# Patient Record
Sex: Female | Born: 1970 | Race: Black or African American | Hispanic: No | Marital: Married
Health system: Southern US, Community
[De-identification: ages and names within clinical notes are randomized; demographics above are authoritative.]

---

## 2008-10-30 ENCOUNTER — Ambulatory Visit: Payer: Self-pay

## 2009-11-19 ENCOUNTER — Ambulatory Visit: Payer: Self-pay

## 2010-11-25 ENCOUNTER — Ambulatory Visit: Payer: Self-pay

## 2011-12-11 ENCOUNTER — Emergency Department: Payer: Self-pay | Admitting: *Deleted

## 2011-12-11 LAB — URINALYSIS, COMPLETE
Blood: NEGATIVE
Glucose,UR: NEGATIVE mg/dL (ref 0–75)
Ketone: NEGATIVE
Leukocyte Esterase: NEGATIVE
Nitrite: NEGATIVE
Ph: 7 (ref 4.5–8.0)
Protein: 100
RBC,UR: <1 /HPF (ref 0–5)
Squamous Epithelial: 4
WBC UR: 1 /HPF (ref 0–5)

## 2011-12-11 LAB — LIPASE, BLOOD: Lipase: 79 U/L (ref 73–393)

## 2011-12-11 LAB — COMPREHENSIVE METABOLIC PANEL
Albumin: 4 g/dL (ref 3.4–5.0)
Alkaline Phosphatase: 101 U/L (ref 50–136)
BUN: 8 mg/dL (ref 7–18)
Bilirubin,Total: 0.3 mg/dL (ref 0.2–1.0)
Co2: 25 mmol/L (ref 21–32)
Creatinine: 0.81 mg/dL (ref 0.60–1.30)
EGFR (Non-African Amer.): >60
Osmolality: 279 (ref 275–301)
SGOT(AST): 20 U/L (ref 15–37)
SGPT (ALT): 13 U/L
Sodium: 140 mmol/L (ref 136–145)
Total Protein: 8.4 g/dL — ABNORMAL HIGH (ref 6.4–8.2)

## 2011-12-11 LAB — TROPONIN I: Troponin-I: <0.02 ng/mL

## 2011-12-11 LAB — PREGNANCY, URINE: Pregnancy Test, Urine: NEGATIVE m[IU]/mL

## 2011-12-11 LAB — CBC
HGB: 10.4 g/dL — ABNORMAL LOW (ref 12.0–16.0)
MCV: 78 fL — ABNORMAL LOW (ref 80–100)
Platelet: 403 10*3/uL (ref 150–440)
RDW: 16.5 % — ABNORMAL HIGH (ref 11.5–14.5)
WBC: 7.5 10*3/uL (ref 3.6–11.0)

## 2011-12-16 ENCOUNTER — Ambulatory Visit: Payer: Self-pay | Admitting: Internal Medicine

## 2011-12-16 ENCOUNTER — Inpatient Hospital Stay: Payer: Self-pay | Admitting: Surgery

## 2011-12-16 LAB — COMPREHENSIVE METABOLIC PANEL
Albumin: 3.2 g/dL — ABNORMAL LOW (ref 3.4–5.0)
BUN: 8 mg/dL (ref 7–18)
Bilirubin,Total: 0.4 mg/dL (ref 0.2–1.0)
Chloride: 105 mmol/L (ref 98–107)
Creatinine: 0.72 mg/dL (ref 0.60–1.30)
EGFR (African American): >60
EGFR (Non-African Amer.): >60
Osmolality: 279 (ref 275–301)
SGOT(AST): 14 U/L — ABNORMAL LOW (ref 15–37)
Sodium: 141 mmol/L (ref 136–145)

## 2011-12-16 LAB — CBC WITH DIFFERENTIAL/PLATELET
Basophil #: 0 10*3/uL (ref 0.0–0.1)
Basophil %: 0.1 %
Eosinophil %: 1.7 %
HCT: 29 % — ABNORMAL LOW (ref 35.0–47.0)
MCH: 24.8 pg — ABNORMAL LOW (ref 26.0–34.0)
MCHC: 31.5 g/dL — ABNORMAL LOW (ref 32.0–36.0)
MCV: 79 fL — ABNORMAL LOW (ref 80–100)
Monocyte #: 0.6 10*3/uL (ref 0.0–0.7)
Monocyte %: 8.9 %
Neutrophil %: 70.6 %
Platelet: 361 10*3/uL (ref 150–440)
RBC: 3.7 10*6/uL — ABNORMAL LOW (ref 3.80–5.20)
WBC: 7.2 10*3/uL (ref 3.6–11.0)

## 2011-12-17 LAB — BASIC METABOLIC PANEL
BUN: 5 mg/dL — ABNORMAL LOW (ref 7–18)
Calcium, Total: 8.3 mg/dL — ABNORMAL LOW (ref 8.5–10.1)
Chloride: 108 mmol/L — ABNORMAL HIGH (ref 98–107)
Co2: 26 mmol/L (ref 21–32)
Creatinine: 0.75 mg/dL (ref 0.60–1.30)
Glucose: 108 mg/dL — ABNORMAL HIGH (ref 65–99)
Osmolality: 281 (ref 275–301)
Sodium: 142 mmol/L (ref 136–145)

## 2011-12-21 LAB — PATHOLOGY REPORT

## 2012-02-14 ENCOUNTER — Ambulatory Visit: Payer: Self-pay

## 2012-05-09 ENCOUNTER — Ambulatory Visit: Payer: Self-pay | Admitting: Obstetrics & Gynecology

## 2012-05-09 LAB — CBC
HCT: 33.3 % — ABNORMAL LOW (ref 35.0–47.0)
MCH: 23 pg — ABNORMAL LOW (ref 26.0–34.0)
MCHC: 31 g/dL — ABNORMAL LOW (ref 32.0–36.0)
RDW: 18.1 % — ABNORMAL HIGH (ref 11.5–14.5)

## 2012-05-18 ENCOUNTER — Ambulatory Visit: Payer: Self-pay | Admitting: Obstetrics & Gynecology

## 2012-05-18 LAB — PREGNANCY, URINE: Pregnancy Test, Urine: NEGATIVE m[IU]/mL

## 2012-05-22 LAB — PATHOLOGY REPORT

## 2013-02-15 ENCOUNTER — Ambulatory Visit: Payer: Self-pay

## 2014-01-15 IMAGING — US ABDOMEN ULTRASOUND LIMITED
1 series · 17 of 25 positions shown · non-contrast
Comparison: none

REASON FOR EXAM: abdominal pain
COMMENTS:

PROCEDURE:     FAZLJI - FAZLJI ABDOMEN UPPER GENERAL  - December 16, 2011  [DATE]
RESULT:

[Series 1: abdomen ultrasound limited · 17 of 79 slices shown]
[im 1/79]
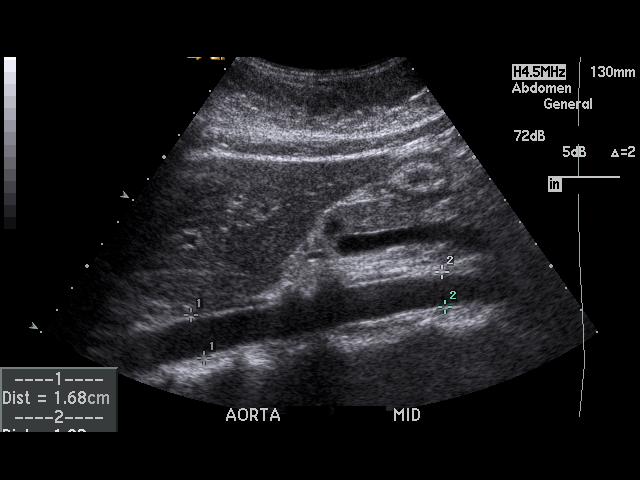
[im 7/79]
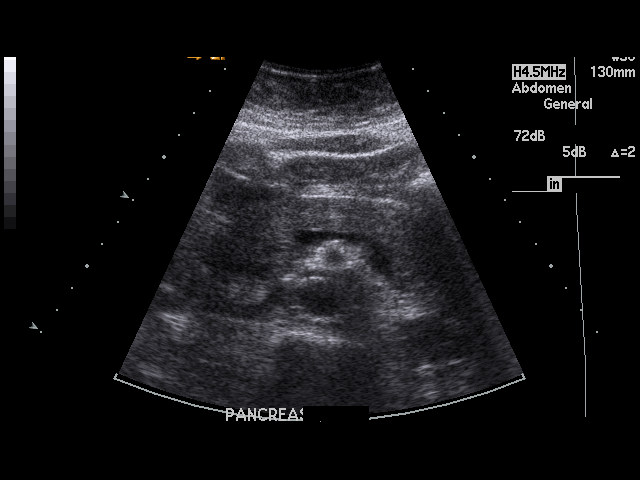
[im 10/79]
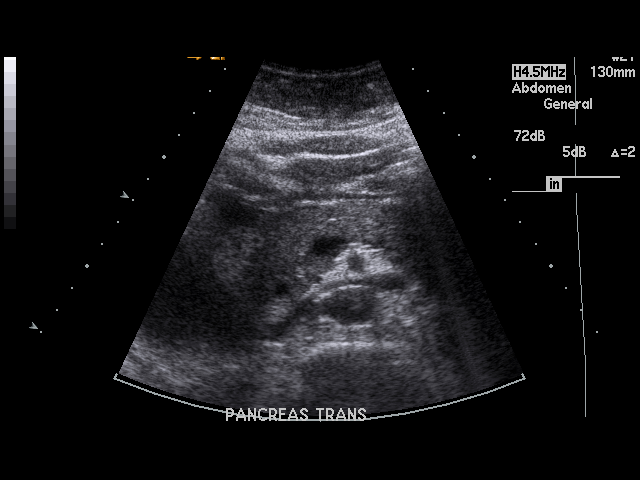
[im 17/79]
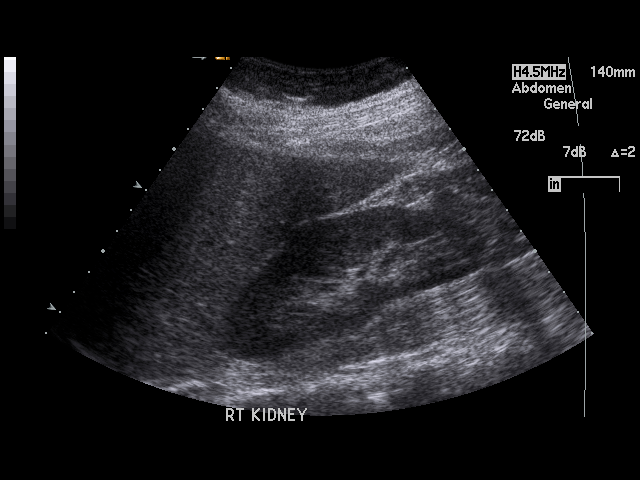
[im 20/79]
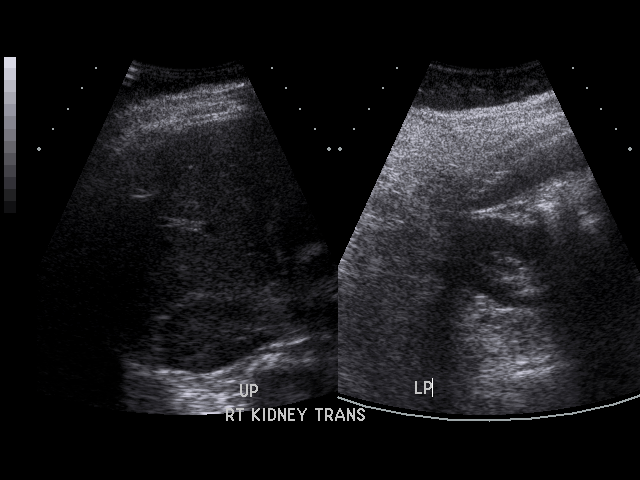
[im 27/79]
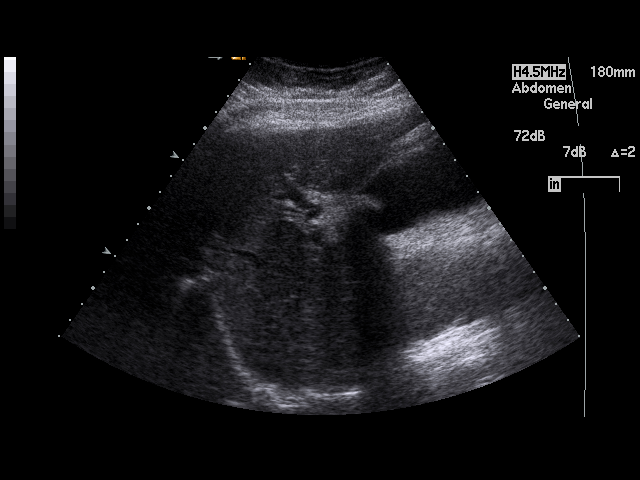
[im 30/79]
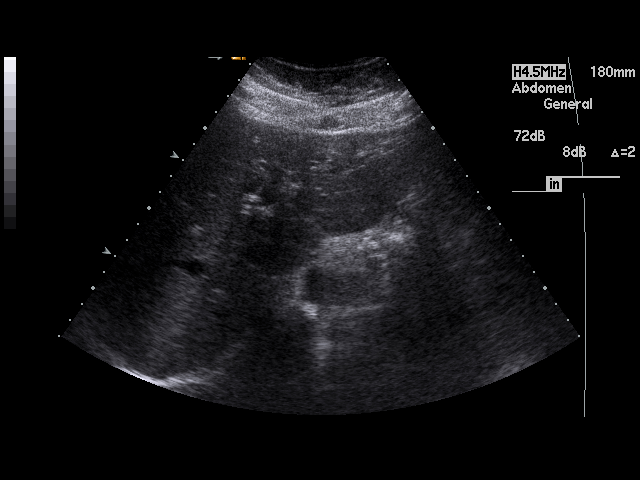
[im 36/79]
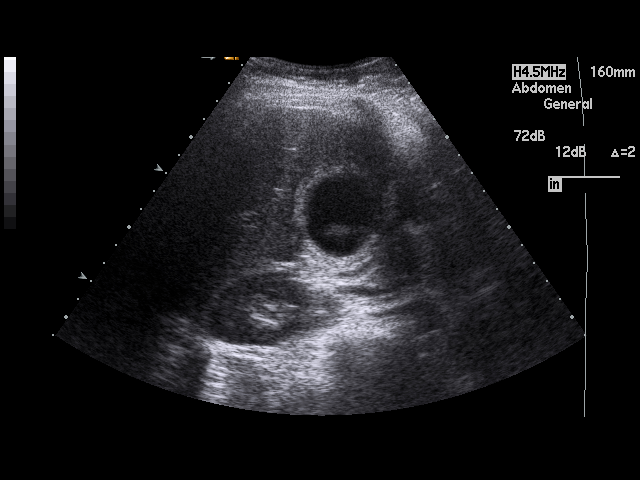
[im 40/79]
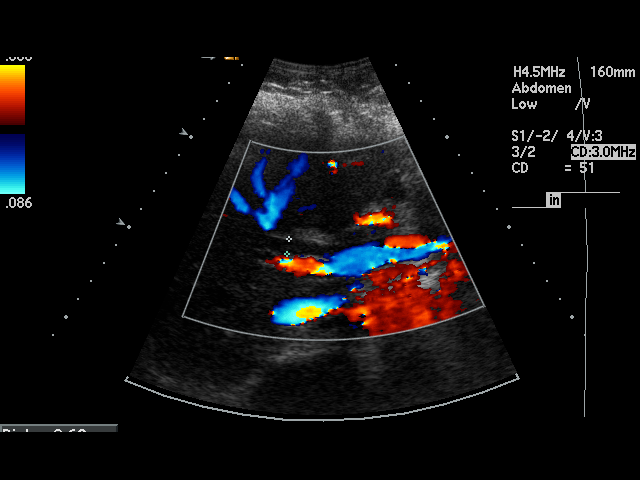
[im 43/79]
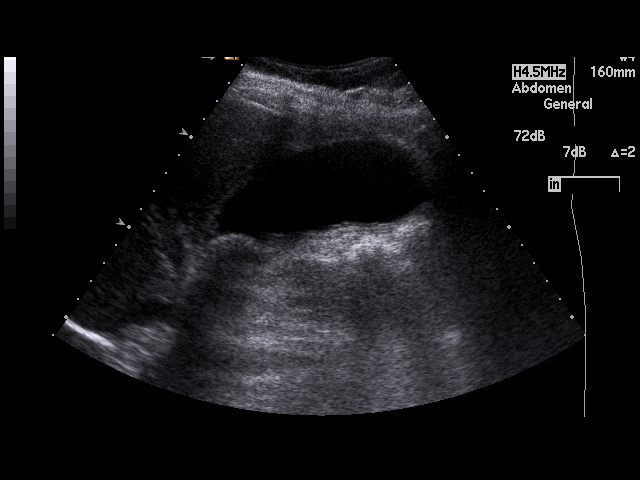
[im 49/79]
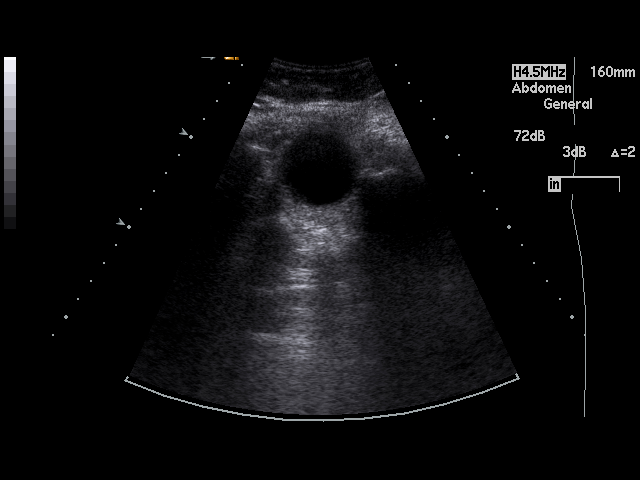
[im 53/79]
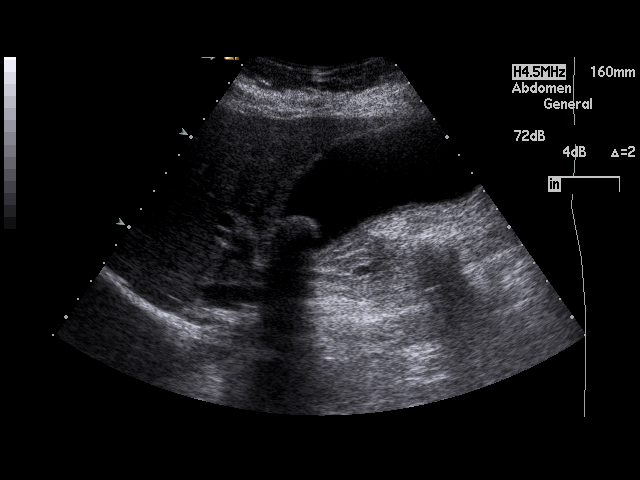
[im 59/79]
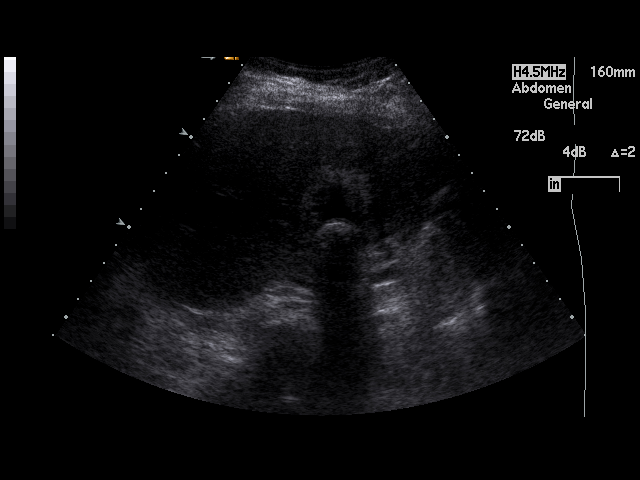
[im 62/79]
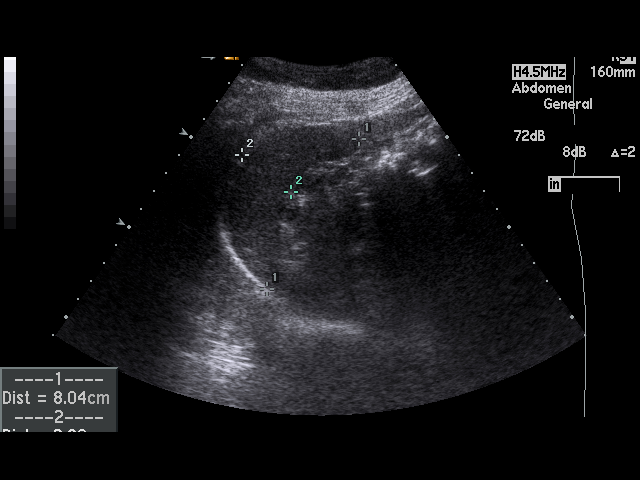
[im 69/79]
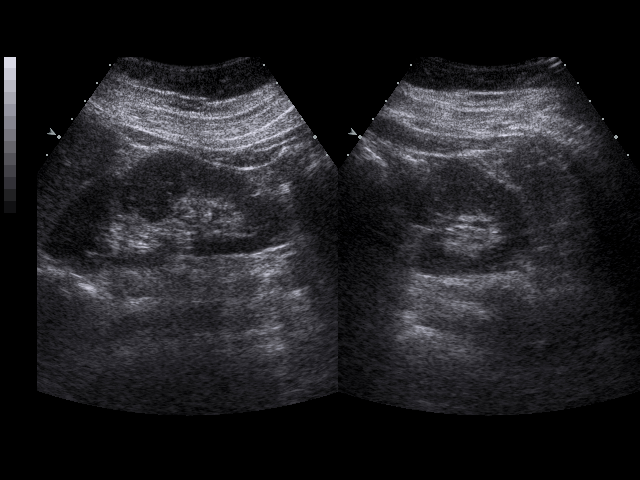
[im 72/79]
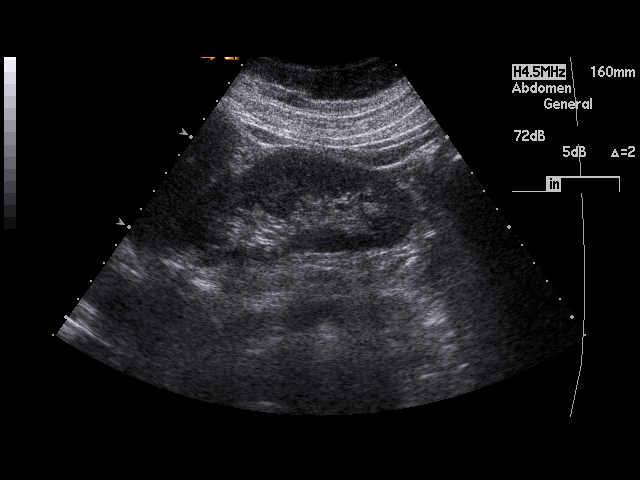
[im 79/79]
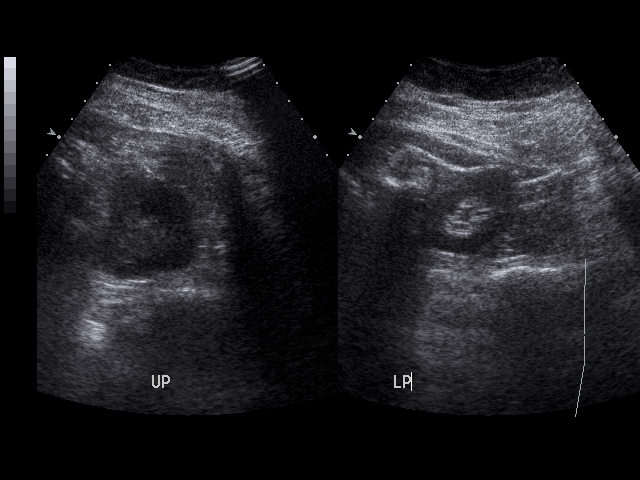

[17 of 25 positions shown; findings below may reference images not displayed]

FINDINGS: The liver demonstrates a homogeneous echotexture. Hepatopetal flow
is identified within the portal vein. The aorta and IVC are unremarkable.
The visualized portions of the pancreas are unremarkable. Spleen is
homogeneous in echotexture and measures 8.047 cm in longitudinal dimensions.
Evaluation of the gallbladder demonstrates a nonmobile gallstone which
appears be lodged within the neck of the gallbladder measuring 2.5 cm in
diameter. There is no evidence of a sonographic Murphy's sign. Gallbladder
wall is thickened and demonstrates findings which may represent areas of
fluid within the wall measuring 6 mm in thickness. Common bile duct measures
6.9 to 7.3 mm in diameter. There is no evidence of intrahepatic biliary
ductal dilatation. There is no evidence of pericholecystic fluid.

The right kidney measures 11.87 x 4.37 x 5.33 cm and the left 11.64 x 5.39 x
5.25 cm.

There is no evidence of hydronephrosis, solid or cystic masses or calculi.
IMPRESSION: Gallstone which appears be lodged within the neck of the gallbladder as well
as gallbladder wall thickening and possibly wall edema. These findings are
concerning for cholecystitis particularly considering the common bile duct
dilatation which is mild to moderate. Surgical consultation is recommended.

## 2014-02-18 ENCOUNTER — Ambulatory Visit: Payer: Self-pay

## 2014-12-30 NOTE — Op Note (Signed)
PATIENT NAME:  Ruth Coleman, [name] MR#:  [id number] DATE OF BIRTH:  Aug 27, 1971  DATE OF PROCEDURE:  05/18/2012  PREOPERATIVE DIAGNOSES:  1. Menorrhagia. 2. Fibroid uterus.   POSTOPERATIVE DIAGNOSES:  1. Menorrhagia. 2. Fibroid uterus.   PROCEDURE: Complete laparoscopic hysterectomy.   SURGEON: Dierdre Searles. Paul Anslie Spadafora, MD   ASSISTANT: Dr. Jean RosenthalJackson    ANESTHESIA: General.   ESTIMATED BLOOD LOSS: 75 mL.   COMPLICATIONS: None.   FINDINGS: Large fibroid uterus with multiple fibroids throughout the uterus including one pedunculated on the right side that is approximately the size of a grapefruit. Ovaries were normal.   DISPOSITION: To recovery room in stable condition.   TECHNIQUE: The patient is prepped and draped in the usual sterile fashion after adequate anesthesia is obtained in the dorsal lithotomy position. Foley catheter is inserted and a V-care device is placed on the cervix and uterus for manipulation purposes.   Attention is then turned to the abdomen where a Veress needle is inserted through a 5 mm infraumbilical incision after Marcaine is used to anesthetize the skin. Veress needle placement is confirmed using the hanging drop technique and the abdomen is then insufflated with CO2 gas. A 5 mm trocar is then inserted under direct visualization with the laparoscope with no injuries or bleeding noted. The patient is placed in Trendelenburg positioning. An 11 mm trocar is placed in the right lower quadrant and a 5 mm trocar is placed in the left lower quadrant lateral to the inferior epigastric blood vessels with no injuries or bleeding noted. The right-sided pedunculated fibroid is grasped and carefully dissected from a right-sided anterior abdominal wall where it is attached to the round ligament at its entry point there. It is carefully enucleated and the round ligaments are carefully clamped, coagulated, and transected. The uteroovarian blood vessels and ligaments are then carefully coagulated  and cut with preservation of the right ovary and its blood supply. Dissection is carried down to the level of the uterine arteries which are carefully coagulated and then transected using the Kleppinger bipolar cautery device as well as the Harmonic scalpel. The same is performed on the left side with careful dissection of the round ligament as well as the uteroovarian blood vessels and ligaments with preservation of the left ovary and its blood supply. Dissection is carried down to the level of the uterine arteries which are carefully coagulated and then transected. Dissection anteriorly is performed with inferior dissection and retraction of the bladder. The V-care device is identified at the cervicovaginal junction through the tissues and a careful circumferential incision is made with the Harmonic scalpel to completely amputate the uterus and cervix along with the fibroid. It is then removed vaginally.   The vaginal cuff is closed with EndoStitch Polysorb sutures in an interrupted fashion with six sutures applied for excellent closure both by visualization as well as pelvic bimanual examination. The pelvic cavity is irrigated with warm fluid. Aspiration of all fluid is performed and excellent hemostasis is visualized. There is no apparent injury to ureter or bowel structures. The patient is leveled, gas is expelled, trocars are removed, skin is closed with Dermabond. The patient goes to the recovery room in stable condition. All sponge, instrument, and needle counts are correct at the conclusion of the case.   ____________________________ R. Annamarie MajorPaul Renelle Stegenga, MD rph:drc D: 05/18/2012 14:59:11 ET T: 05/19/2012 09:34:54 ET JOB#: [id number]  cc: Dierdre Searles. Paul Kassia Demarinis, MD, <Dictator> Nadara MustardOBERT P Farha Dano MD ELECTRONICALLY SIGNED 05/21/2012 7:21

## 2015-01-04 NOTE — Op Note (Signed)
PATIENT NAME:  Ruth Coleman, Ruth Coleman MR#:  [id number] DATE OF BIRTH:  April 06, 1971  DATE OF PROCEDURE:  12/17/2011  PREOPERATIVE DIAGNOSIS: Acute calculus cholecystitis.   POSTOPERATIVE DIAGNOSIS: Acute calculus cholecystitis, with hydrops.  PROCEDURE PERFORMED: Laparoscopic cholecystectomy with intraoperative cholangiography.   SURGEON: Natale LayMark Keante Urizar, MD  FINDINGS: Hydrops, large stone (at least 3 cm), normal cholangiogram.   DRAINS: Jackson-Pratt x1 in gallbladder fossa.  ESTIMATED BLOOD LOSS: 50 mL.   DESCRIPTION OF PROCEDURE: With the patient in the supine position, general endotracheal anesthesia was induced. Her left arm was padded and tucked at her side. Her abdomen was widely prepped and draped with ChloraPrep with perioperative antibiotics and deep vein thrombosis prophylaxis having administered. Time out was observed.   A 12 mm blunt Hassan trocar was placed through an open technique through an infraumbilical transversely oriented skin incision with stay sutures being passed through the fascia. Pneumoperitoneum was established. The patient was then positioned in reverse Trendelenburg and airplane right side up. A 5 mm Bladeless trocar was placed in the mid epigastrium to the right of the falciform ligament. Two 5 mm ports were then placed in the right subcostal margin. The gallbladder was decompressed of clear fluid with an aspiration cannula. The gallbladder was then grasped along its fundus and elevated towards the right shoulder. Lateral traction was placed in Hartmann's pouch. The gallbladder was markedly edematous. Dissection of the hepatoduodenal ligament was then performed. A small rent was made in the anterior surface of the cystic duct, clear bile was extruded. Critical view of safety cholecystectomy was performed. A 4 French percutaneously placed cholangiogram catheter with occluding balloon was placed into the cystic duct and real-time fluoroscopic cholangiography was then performed.  Cholangiogram appeared to be normal. With the catheter having been removed, the cystic duct was triply clipped on the portal side, singly clipped on the gallbladder side, and divided. Immediately adjacent a cystic artery was identified, doubly clipped on the portal side, singly clipped on the gallbladder side, and divided. One small draining lymphatic branch was divided between single hemoclips. The gallbladder was then retrieved off the gallbladder fossa without any further spillage of bile, placed into an Endo Catch bag, and retrieved. The right upper quadrant was irrigated with 2 liters of warm normal saline and aspirated dry. There was no evidence of bile leakage. Point hemostasis was obtained with electrocautery and the application of Surgicel with thrombin application. A 19 mm Blake drain was directed into the space and exited the right upper quadrant port site. Ports were then removed under direct vision. The fascial defect was closed with multiple interrupted #0 Vicryl sutures in vertical orientation. 3-0 Vicryl was used to obliterate the dead space and the umbilical incision. 4-0 Vicryl subcuticular was placed in the skin edges. Additional 4-0 nylons were placed in the umbilical incision for hemostasis. Steri-Strips, benzoin, and occlusive dressings were then placed, and the patient was subsequently extubated and taken to the recovery room in stable and satisfactory condition by anesthesia.  ____________________________ Redge GainerMark A. Egbert GaribaldiBird, MD FACS mab:slb D: 12/17/2011 13:40:59 ET T: 12/17/2011 14:22:15 ET JOB#: [id number]  cc: Loraine LericheMark A. Egbert GaribaldiBird, MD, <Dictator> Marya AmslerMarshall W. Dareen PianoAnderson, MD Julietta Batterman Kela MillinA Fumiko Cham MD ELECTRONICALLY SIGNED 12/17/2011 16:30

## 2015-01-04 NOTE — H&P (Signed)
PATIENT NAME:  Ruth Coleman, Ruth Coleman MR#:  [id number] DATE OF BIRTH:  [birth year]  DATE OF ADMISSION:  12/16/2011  ADMITTING DIAGNOSIS: Acute calculus cholecystitis.   HISTORY: This is a 44 year old otherwise healthy white female who went to the Emergency Room on Sunday the 31st of March with sudden onset of epigastric and right upper quadrant abdominal pain associated with nausea and vomiting five times. Laboratory values and  urinalysis obtained at that time were unremarkable. The patient had some symptomatic relief with IV narcotics and antiemetics while in the Emergency Room. She contacted her primary care physician, Dr. Einar CrowMarshall Anderson, on Tuesday because she still was having some soreness and some poor appetite. An ultrasound was obtained today which demonstrates a large 2.5-cm stone lodged within the gallbladder neck, pericholecystic fluid, and gallbladder wall edema. The patient states that she had some discomfort up until yesterday, but no jaundice, no fevers. Of note, she has a sister with a previous history of cholecystectomy.   ALLERGIES: None.   MEDICATIONS:  1. Norco given through the Emergency Room. 2. Zofran given through the Emergency Room.   PAST MEDICAL HISTORY: Uterine fibroids.   PAST SURGICAL HISTORY: None.   SOCIAL HISTORY: She is married and employed. Does not smoke. Does not drink.   PHYSICAL EXAMINATION:  GENERAL: She is alert and oriented. no scleral icterus.  VITAL SIGNS: Temperature 98.3, pulse 130, respiratory rate 20, blood pressure 141/87.   LUNGS: Clear.   HEART: Tachycardic.   ABDOMEN: Soft and nontender. I cannot elicit a Murphy's sign.   NEUROLOGIC/PSYCHIATRIC: normal mode, judgement and affect  EXTREMITIES: Warm and well perfused. No obvious rash. No clubbing. No cyanosis. No edema.   LABS/STUDIES: Laboratory values are pending. Ultrasonography obtained from earlier today is as described above and was personally reviewed on the PACS monitor.    IMPRESSION:  Large gallbladder stone which appears to be lodged within the gallbladder neck in a patient who currently does not have any symptoms. She has had no previous episodes of this in the past.   PLAN: The patient will be admitted. I will check her liver function tests, CBC, and lipase. I recommended to the patient as this is her first episode the likelihood of recurrent symptoms with this is on the order of 30 to 35%. I will check her laboratory values and give her intravenous Unasyn and narcotics as needed and re-examine later today with the decision for either inpatient or outpatient cholecystectomy based on further data collection.   total time spent 45 minutes.  ____________________________ Redge GainerMark A. Egbert GaribaldiBird, MD FACS mab:bjt D: 12/16/2011 13:57:01 ET T: 12/16/2011 14:19:28 ET JOB#: [id number]  cc: Loraine LericheMark A. Egbert GaribaldiBird, MD, <Dictator> Whittney Steenson A Kylei Purington MD ELECTRONICALLY SIGNED 12/17/2011 9:50

## 2015-06-22 ENCOUNTER — Other Ambulatory Visit: Payer: Self-pay | Admitting: Certified Nurse Midwife

## 2015-06-22 DIAGNOSIS — Z1231 Encounter for screening mammogram for malignant neoplasm of breast: Secondary | ICD-10-CM

## 2015-06-26 ENCOUNTER — Ambulatory Visit
Admission: RE | Admit: 2015-06-26 | Discharge: 2015-06-26 | Disposition: A | Discharge status: Home / Self Care | Payer: 59 | Source: Ambulatory Visit | Attending: Certified Nurse Midwife | Admitting: Certified Nurse Midwife

## 2015-06-26 ENCOUNTER — Other Ambulatory Visit: Payer: Self-pay | Admitting: Certified Nurse Midwife

## 2015-06-26 DIAGNOSIS — Z1231 Encounter for screening mammogram for malignant neoplasm of breast: Secondary | ICD-10-CM

## 2016-11-30 ENCOUNTER — Ambulatory Visit: Payer: Self-pay | Admitting: Certified Nurse Midwife

## 2019-09-11 ENCOUNTER — Ambulatory Visit: Payer: 59 | Attending: Internal Medicine

## 2019-09-11 DIAGNOSIS — Z20822 Contact with and (suspected) exposure to covid-19: Secondary | ICD-10-CM

## 2019-09-12 LAB — NOVEL CORONAVIRUS, NAA: SARS-CoV-2, NAA: NOT DETECTED

## 2020-09-29 ENCOUNTER — Other Ambulatory Visit: Payer: 59

## 2020-09-30 ENCOUNTER — Other Ambulatory Visit: Payer: 59

## 2020-09-30 DIAGNOSIS — Z20822 Contact with and (suspected) exposure to covid-19: Secondary | ICD-10-CM

## 2020-10-02 LAB — SARS-COV-2, NAA 2 DAY TAT

## 2020-10-02 LAB — NOVEL CORONAVIRUS, NAA: SARS-CoV-2, NAA: DETECTED — AB
